# Patient Record
Sex: Female | Born: 1990 | Hispanic: Yes | Marital: Single | Smoking: Never smoker
Health system: Southern US, Community
[De-identification: ages and names within clinical notes are randomized; demographics above are authoritative.]

## PROBLEM LIST (undated history)

## (undated) DIAGNOSIS — Z789 Other specified health status: Secondary | ICD-10-CM

---

## 2012-08-05 ENCOUNTER — Ambulatory Visit: Payer: Self-pay | Admitting: Advanced Practice Midwife

## 2012-09-17 ENCOUNTER — Ambulatory Visit: Payer: Self-pay | Admitting: Advanced Practice Midwife

## 2013-02-10 ENCOUNTER — Inpatient Hospital Stay: Payer: Self-pay | Admitting: Obstetrics and Gynecology

## 2013-02-10 LAB — CBC WITH DIFFERENTIAL/PLATELET
BASOS ABS: 0 10*3/uL (ref 0.0–0.1)
Basophil %: 0.1 %
EOS PCT: 0.3 %
Eosinophil #: 0 10*3/uL (ref 0.0–0.7)
HCT: 35.5 % (ref 35.0–47.0)
HGB: 12.2 g/dL (ref 12.0–16.0)
Lymphocyte #: 1.7 10*3/uL (ref 1.0–3.6)
Lymphocyte %: 12.8 %
MCH: 30.8 pg (ref 26.0–34.0)
MCHC: 34.5 g/dL (ref 32.0–36.0)
MCV: 89 fL (ref 80–100)
MONO ABS: 0.7 x10 3/mm (ref 0.2–0.9)
Monocyte %: 5 %
Neutrophil #: 10.7 10*3/uL — ABNORMAL HIGH (ref 1.4–6.5)
Neutrophil %: 81.8 %
Platelet: 260 10*3/uL (ref 150–440)
RBC: 3.97 10*6/uL (ref 3.80–5.20)
RDW: 12.8 % (ref 11.5–14.5)
WBC: 13 10*3/uL — AB (ref 3.6–11.0)

## 2013-02-10 LAB — HEMOGLOBIN: HGB: 9.4 g/dL — ABNORMAL LOW (ref 12.0–16.0)

## 2013-02-10 LAB — GC/CHLAMYDIA PROBE AMP

## 2013-02-11 LAB — HEMOGLOBIN: HGB: 6.8 g/dL — ABNORMAL LOW (ref 12.0–16.0)

## 2013-02-11 LAB — HEMATOCRIT: HCT: 20.1 % — AB (ref 35.0–47.0)

## 2013-02-12 LAB — HEMATOCRIT: HCT: 18.4 % — ABNORMAL LOW (ref 35.0–47.0)

## 2014-06-08 NOTE — H&P (Signed)
L&D Evaluation:  History:  HPI P0 at term active labor   Presents with contractions   Patient's Medical History No Chronic Illness   Patient's Surgical History none   Medications Pre Serbiaatal Vitamins   Social History none   Family History Non-Contributory   ROS:  ROS All systems were reviewed.  HEENT, CNS, GI, GU, Respiratory, CV, Renal and Musculoskeletal systems were found to be normal.   Exam:  Vital Signs stable   Abdomen gravid, tender with contractions   Estimated Fetal Weight Average for gestational age   Back no CVAT   Reflexes 1+   Pelvic c/c   Mebranes Ruptured   FHT normal rate with no decels   Ucx regular   Impression:  Impression active labor   Plan:  Comments antic svd   Electronic Signatures: Margaretha GlassingEvans, Ricky L (MD)  (Signed 13-Jan-15 10:57)  Authored: L&D Evaluation   Last Updated: 13-Jan-15 10:57 by Margaretha GlassingEvans, Ricky L (MD)

## 2016-01-30 NOTE — L&D Delivery Note (Signed)
Delivery Note At 8:30 AM a viable and healthy female "Erica Livingston" was delivered via Vaginal, Spontaneous (Presentation: DOA; compound with arm ).  APGAR: 9, 9; weight 7 lb 6.9 oz (3370 g).   Placenta status: spontaneous.  Cord: 3VC, nuchal, delivered through with the following complications: none.  Anesthesia:  local Episiotomy: None Lacerations:  Directly clitoral Suture Repair: 3.0 vicryl rapide Est. Blood Loss (mL):  800  Mom to postpartum.  Baby to Couplet care / Skin to Skin.  26yo G2P1001 at 39+1wks presented in active labor at 8cm. She progressed quickly to fully dilated, was AROMed for meconium stained fluid, and pushed through a single push. The baby delivered with head and then immediately the body followed in a single push, with an anterior compound arm and nuchal cord. The baby was vigorous and placed on maternal abdomen. Delayed cord clamping x60 sec. Placenta spontaneously delivered and continuous bleeding noted. Examination of perineum found actively bleeding direct superior tear through clitorus. 1% lidocaine, 15ml used, and red rubber placed. Figure of 8 and running suture used to reapproximate tissue and establish hemostasis. Small perineal tear without bleeding and not through the mucosa - inclusion cyst expressed.   Continued uterine bleeding, slow trickle, noted. 2 manual evacuations of LUS clot with firm fundus and resolution after each. 800mcg rectal cytotec and 0.2mg  methergine IM given. At this time, small continued bleeding noted.   Erica Livingston 12/19/2016, 9:14 AM

## 2016-05-30 ENCOUNTER — Other Ambulatory Visit: Payer: Self-pay | Admitting: Nurse Practitioner

## 2016-05-30 DIAGNOSIS — Z369 Encounter for antenatal screening, unspecified: Secondary | ICD-10-CM

## 2016-05-31 LAB — OB RESULTS CONSOLE RUBELLA ANTIBODY, IGM: RUBELLA: IMMUNE

## 2016-05-31 LAB — OB RESULTS CONSOLE ABO/RH: RH Type: POSITIVE

## 2016-05-31 LAB — OB RESULTS CONSOLE HEPATITIS B SURFACE ANTIGEN: Hepatitis B Surface Ag: NEGATIVE

## 2016-05-31 LAB — OB RESULTS CONSOLE VARICELLA ZOSTER ANTIBODY, IGG: VARICELLA IGG: NON-IMMUNE/NOT IMMUNE

## 2016-05-31 LAB — OB RESULTS CONSOLE RPR: RPR: NONREACTIVE

## 2016-05-31 LAB — OB RESULTS CONSOLE ANTIBODY SCREEN: ANTIBODY SCREEN: NEGATIVE

## 2016-06-18 ENCOUNTER — Encounter: Payer: Self-pay | Admitting: *Deleted

## 2016-06-18 ENCOUNTER — Ambulatory Visit
Admission: RE | Admit: 2016-06-18 | Discharge: 2016-06-18 | Disposition: A | Discharge status: Home / Self Care | Payer: Medicaid Other | Source: Ambulatory Visit | Attending: Maternal & Fetal Medicine | Admitting: Maternal & Fetal Medicine

## 2016-06-18 ENCOUNTER — Ambulatory Visit (HOSPITAL_BASED_OUTPATIENT_CLINIC_OR_DEPARTMENT_OTHER)
Admission: RE | Admit: 2016-06-18 | Discharge: 2016-06-18 | Disposition: A | Discharge status: Home / Self Care | Payer: Medicaid Other | Source: Ambulatory Visit | Attending: Maternal & Fetal Medicine | Admitting: Maternal & Fetal Medicine

## 2016-06-18 VITALS — BP 116/54 | HR 72 | Temp 97.9°F | Resp 16 | Ht 61.0 in | Wt 129.0 lb

## 2016-06-18 DIAGNOSIS — Z369 Encounter for antenatal screening, unspecified: Secondary | ICD-10-CM

## 2016-06-18 DIAGNOSIS — Z3689 Encounter for other specified antenatal screening: Secondary | ICD-10-CM | POA: Insufficient documentation

## 2016-06-18 DIAGNOSIS — O3481 Maternal care for other abnormalities of pelvic organs, first trimester: Secondary | ICD-10-CM | POA: Insufficient documentation

## 2016-06-18 DIAGNOSIS — N83201 Unspecified ovarian cyst, right side: Secondary | ICD-10-CM | POA: Diagnosis not present

## 2016-06-18 HISTORY — DX: Other specified health status: Z78.9

## 2016-06-18 NOTE — Progress Notes (Addendum)
Referring physician:  [facility] Department Length of Consultation: 45 minutes   Erica Livingston  was referred to [facility] for genetic counseling to review prenatal screening and testing options.  This note summarizes the information we discussed.    We offered the following routine screening tests for this pregnancy:  First trimester screening, which includes nuchal translucency ultrasound screen and first trimester maternal serum marker screening.  The nuchal translucency has approximately an 80% detection rate for Down syndrome and can be positive for other chromosome abnormalities as well as congenital heart defects.  When combined with a maternal serum marker screening, the detection rate is up to 90% for Down syndrome and up to 97% for trisomy 18.     Maternal serum marker screening, a blood test that measures pregnancy proteins, can provide risk assessments for Down syndrome, trisomy 18, and open neural tube defects (spina bifida, anencephaly). Because it does not directly examine the fetus, it cannot positively diagnose or rule out these problems.  Targeted ultrasound uses high frequency sound waves to create an image of the developing fetus.  An ultrasound is often recommended as a routine means of evaluating the pregnancy.  It is also used to screen for fetal anatomy problems (for example, a heart defect) that might be suggestive of a chromosomal or other abnormality.   Should these screening tests indicate an increased concern, then the following additional testing options would be offered:  The chorionic villus sampling procedure is available for first trimester chromosome analysis.  This involves the withdrawal of a small amount of chorionic villi (tissue from the developing placenta).  Risk of pregnancy loss is estimated to be approximately 1 in 200 to 1 in 100 (0.5 to 1%).  There is approximately a 1% (1 in 100) chance that the CVS  chromosome results will be unclear.  Chorionic villi cannot be tested for neural tube defects.     Amniocentesis involves the removal of a small amount of amniotic fluid from the sac surrounding the fetus with the use of a thin needle inserted through the maternal abdomen and uterus.  Ultrasound guidance is used throughout the procedure.  Fetal cells from amniotic fluid are directly evaluated and > 99.5% of chromosome problems and > 98% of open neural tube defects can be detected. This procedure is generally performed after the 15th week of pregnancy.  The main risks to this procedure include complications leading to miscarriage in less than 1 in 200 cases (0.5%).  As another option for information if the pregnancy is suspected to be an an increased chance for certain chromosome conditions, we also reviewed the availability of cell free fetal DNA testing from maternal blood to determine whether or not the baby may have either Down syndrome, trisomy 45, or trisomy 110.  This test utilizes a maternal blood sample and DNA sequencing technology to isolate circulating cell free fetal DNA from maternal plasma.  The fetal DNA can then be analyzed for DNA sequences that are derived from the three most common chromosomes involved in aneuploidy, chromosomes 13, 18, and 21.  If the overall amount of DNA is greater than the expected level for any of these chromosomes, aneuploidy is suspected.  While we do not consider it a replacement for invasive testing and karyotype analysis, a negative result from this testing would be reassuring, though not a guarantee of a normal chromosome complement for the baby.  An abnormal result is certainly suggestive of an abnormal  chromosome complement, though we would still recommend CVS or amniocentesis to confirm any findings from this testing.  Cystic Fibrosis and Spinal Muscular Atrophy (SMA) screening were also discussed with the patient. Both conditions are recessive, which means that  both parents must be carriers in order to have a child with the disease.  Cystic fibrosis (CF) is one of the most common genetic conditions in persons of Caucasian ancestry.  This condition occurs in approximately 1 in 2,500 Caucasian persons and results in thickened secretions in the lungs, digestive, and reproductive systems.  For a baby to be at risk for having CF, both of the parents must be carriers for this condition.  Approximately 1 in 50 Caucasian persons is a carrier for CF.  Current carrier testing looks for the most common mutations in the gene for CF and can detect approximately 90% of carriers in the Caucasian population.  This means that the carrier screening can greatly reduce, but cannot eliminate, the chance for an individual to have a child with CF.  If an individual is found to be a carrier for CF, then carrier testing would be available for the partner. As part of [place]'s newborn screening profile, all babies born in the state of [place] will have a two-tier screening process.  Specimens are first tested to determine the concentration of immunoreactive trypsinogen (IRT).  The top 5% of specimens with the highest IRT values then undergo DNA testing using a panel of over 40 common CF mutations. SMA is a neurodegenerative disorder that leads to atrophy of skeletal muscle and overall weakness.  This condition is also more prevalent in the Caucasian population, with 1 in 40-1 in 60 persons being a carrier and 1 in 6,000-1 in 10,000 children being affected.  There are multiple forms of the disease, with some causing death in infancy to other forms with survival into adulthood.  The genetics of SMA is complex, but carrier screening can detect up to 95% of carriers in the Caucasian population.  Similar to CF, a negative result can greatly reduce, but cannot eliminate, the chance to have a child with SMA.  We obtained a detailed family history and pregnancy history.  The patient  reported one paternal first cousin of the father of the baby who had lifelong disabilities and passed away at 27 years of age.  She is not aware of the cause for her condition, but states that she could not walk to talk or move normally.  We discussed that there may be many different reasons for disabilities but that without a specific name for the condition or known cause, it is difficult to estimate the chance for other family members to have a similar condition.  If more is learned or medical records are available, we are happy to discuss this further.  The remainder of the family history was reported to be unremarkable for birth defects, intellectual delays, recurrent pregnancy loss or known chromosome abnormalities.  Ms. Lysette Lindenbaum stated that this is her second pregnancy.  She and her partner have a healthy 47 year old daughter together.  She reported no complications or exposures in this pregnancy that would be expected to increase the risk for birth defects.  Prior testing at [facility] was normal for hemoglobinopathies.  After consideration of the options, Ms. Christyann Manolis elected to proceed with first trimester screening.  She declined CF and SMA carrier screening.  An ultrasound was performed at the time of the visit.  The gestational age  was consistent with  12 weeks.  Fetal anatomy could not be assessed due to early gestational age.  Please refer to the ultrasound report for details of that study.  Ms. Eilene GhaziMartinez Moreno was encouraged to call with questions or concerns.  We can be contacted at [phone].    Cherly Andersoneborah F. Wells, MS, CGC  I was immediately available and supervising. Argentina PonderAndra H. Saleem Coccia, MD [facility].

## 2016-06-21 ENCOUNTER — Telehealth: Payer: Self-pay | Admitting: Obstetrics and Gynecology

## 2016-06-21 NOTE — Telephone Encounter (Signed)
  Ms. Erica Livingston elected to undergo First Trimester screening on 06/18/2016.  To review, first trimester screening, includes nuchal translucency ultrasound screen and/or first trimester maternal serum marker screening.  The nuchal translucency has approximately an 80% detection rate for Down syndrome and can be positive for other chromosome abnormalities as well as heart defects.  When combined with a maternal serum marker screening, the detection rate is up to 90% for Down syndrome and up to 97% for trisomy 13 and 18.     The results of the First Trimester Nuchal Translucency and Biochemical Screening were within normal range.  The risk for Down syndrome is now estimated to be less than 1 in 10,000.  The risk for Trisomy 13/18 is also estimated to be less than 1 in 10,000.  Should more definitive information be desired, we would offer amniocentesis.  Because we do not yet know the effectiveness of combined first and second trimester screening, we do not recommend a maternal serum screen to assess the chance for chromosome conditions.  However, if screening for neural tube defects is desired, maternal serum screening for AFP only can be performed between 15 and [redacted] weeks gestation.      Erica Andersoneborah F. Atlas Crossland, MS, CGC

## 2016-07-26 ENCOUNTER — Other Ambulatory Visit: Payer: Self-pay | Admitting: *Deleted

## 2016-07-26 DIAGNOSIS — Z3689 Encounter for other specified antenatal screening: Secondary | ICD-10-CM

## 2016-07-30 ENCOUNTER — Ambulatory Visit
Admission: RE | Admit: 2016-07-30 | Discharge: 2016-07-30 | Disposition: A | Discharge status: Home / Self Care | Payer: Self-pay | Source: Ambulatory Visit | Attending: Obstetrics and Gynecology | Admitting: Obstetrics and Gynecology

## 2016-07-30 DIAGNOSIS — Z3689 Encounter for other specified antenatal screening: Secondary | ICD-10-CM | POA: Insufficient documentation

## 2016-07-30 DIAGNOSIS — Z3A18 18 weeks gestation of pregnancy: Secondary | ICD-10-CM | POA: Insufficient documentation

## 2016-10-08 LAB — OB RESULTS CONSOLE HIV ANTIBODY (ROUTINE TESTING): HIV: NONREACTIVE

## 2016-11-26 LAB — OB RESULTS CONSOLE GC/CHLAMYDIA
CHLAMYDIA, DNA PROBE: NEGATIVE
GC PROBE AMP, GENITAL: NEGATIVE

## 2016-11-26 LAB — OB RESULTS CONSOLE GBS: GBS: NEGATIVE

## 2016-12-19 ENCOUNTER — Other Ambulatory Visit: Payer: Self-pay

## 2016-12-19 ENCOUNTER — Inpatient Hospital Stay
Admission: EM | Admit: 2016-12-19 | Discharge: 2016-12-20 | DRG: 807 | Disposition: A | Discharge status: Home / Self Care | Payer: Medicaid Other | Attending: Obstetrics and Gynecology | Admitting: Obstetrics and Gynecology

## 2016-12-19 DIAGNOSIS — O326XX0 Maternal care for compound presentation, not applicable or unspecified: Secondary | ICD-10-CM | POA: Diagnosis present

## 2016-12-19 DIAGNOSIS — Z3493 Encounter for supervision of normal pregnancy, unspecified, third trimester: Secondary | ICD-10-CM

## 2016-12-19 DIAGNOSIS — Z3A39 39 weeks gestation of pregnancy: Secondary | ICD-10-CM | POA: Diagnosis not present

## 2016-12-19 DIAGNOSIS — Z3483 Encounter for supervision of other normal pregnancy, third trimester: Secondary | ICD-10-CM | POA: Diagnosis present

## 2016-12-19 LAB — TYPE AND SCREEN
ABO/RH(D): B POS
Antibody Screen: NEGATIVE

## 2016-12-19 LAB — CBC
HEMATOCRIT: 40.8 % (ref 35.0–47.0)
Hemoglobin: 14.1 g/dL (ref 12.0–16.0)
MCH: 31.5 pg (ref 26.0–34.0)
MCHC: 34.6 g/dL (ref 32.0–36.0)
MCV: 91.1 fL (ref 80.0–100.0)
Platelets: 253 10*3/uL (ref 150–440)
RBC: 4.48 MIL/uL (ref 3.80–5.20)
RDW: 12.8 % (ref 11.5–14.5)
WBC: 12.8 10*3/uL — ABNORMAL HIGH (ref 3.6–11.0)

## 2016-12-19 MED ORDER — SIMETHICONE 80 MG PO CHEW: 80.0000 mg | CHEWABLE_TABLET | ORAL | Status: DC | PRN

## 2016-12-19 MED ORDER — SOD CITRATE-CITRIC ACID 500-334 MG/5ML PO SOLN: 30.0000 mL | ORAL | Status: DC | PRN

## 2016-12-19 MED ORDER — LACTATED RINGERS IV SOLN
500.0000 mL | INTRAVENOUS | Status: DC | PRN
Start: 2016-12-19 — End: 2016-12-20

## 2016-12-19 MED ORDER — OXYTOCIN 10 UNIT/ML IJ SOLN
INTRAMUSCULAR | Status: AC
  Filled 2016-12-19: qty 2

## 2016-12-19 MED ORDER — ONDANSETRON HCL 4 MG/2ML IJ SOLN: 4.0000 mg | INTRAMUSCULAR | Status: DC | PRN

## 2016-12-19 MED ORDER — METHYLERGONOVINE MALEATE 0.2 MG/ML IJ SOLN
0.2000 mg | Freq: Once | INTRAMUSCULAR | Status: AC
  Administered 2016-12-19: 0.2 mg via INTRAMUSCULAR

## 2016-12-19 MED ORDER — BENZOCAINE-MENTHOL 20-0.5 % EX AERO: 1.0000 "application " | INHALATION_SPRAY | CUTANEOUS | Status: DC | PRN

## 2016-12-19 MED ORDER — MISOPROSTOL 200 MCG PO TABS
ORAL_TABLET | ORAL | Status: AC
  Administered 2016-12-19: 800 ug
  Filled 2016-12-19: qty 4

## 2016-12-19 MED ORDER — ACETAMINOPHEN 325 MG PO TABS: 650.0000 mg | ORAL_TABLET | ORAL | Status: DC | PRN

## 2016-12-19 MED ORDER — BUTORPHANOL TARTRATE 1 MG/ML IJ SOLN: 1.0000 mg | INTRAMUSCULAR | Status: DC | PRN

## 2016-12-19 MED ORDER — LIDOCAINE HCL (PF) 1 % IJ SOLN: 30.0000 mL | INTRAMUSCULAR | Status: DC | PRN

## 2016-12-19 MED ORDER — OXYCODONE HCL 5 MG PO TABS: 10.0000 mg | ORAL_TABLET | ORAL | Status: DC | PRN

## 2016-12-19 MED ORDER — LACTATED RINGERS IV SOLN
INTRAVENOUS | Status: DC
  Administered 2016-12-19: 08:00:00 via INTRAVENOUS

## 2016-12-19 MED ORDER — LIDOCAINE HCL (PF) 1 % IJ SOLN
INTRAMUSCULAR | Status: AC
  Filled 2016-12-19: qty 30

## 2016-12-19 MED ORDER — HYDROCORTISONE 2.5 % RE CREA
TOPICAL_CREAM | RECTAL | Status: DC | PRN
Start: 2016-12-19 — End: 2016-12-20
  Filled 2016-12-19: qty 28.35

## 2016-12-19 MED ORDER — METHYLERGONOVINE MALEATE 0.2 MG/ML IJ SOLN
INTRAMUSCULAR | Status: AC
  Administered 2016-12-19: 0.2 mg via INTRAMUSCULAR
  Filled 2016-12-19: qty 1

## 2016-12-19 MED ORDER — OXYTOCIN BOLUS FROM INFUSION
500.0000 mL | Freq: Once | INTRAVENOUS | Status: AC
  Administered 2016-12-19: 500 mL via INTRAVENOUS

## 2016-12-19 MED ORDER — OXYTOCIN 40 UNITS IN LACTATED RINGERS INFUSION - SIMPLE MED
2.5000 [IU]/h | INTRAVENOUS | Status: DC
  Administered 2016-12-19: 2.5 [IU]/h via INTRAVENOUS

## 2016-12-19 MED ORDER — PRENATAL MULTIVITAMIN CH
1.0000 | ORAL_TABLET | Freq: Every day | ORAL | Status: DC
  Administered 2016-12-19: 1 via ORAL
  Filled 2016-12-19: qty 1

## 2016-12-19 MED ORDER — ONDANSETRON HCL 4 MG/2ML IJ SOLN: 4.0000 mg | Freq: Four times a day (QID) | INTRAMUSCULAR | Status: DC | PRN

## 2016-12-19 MED ORDER — DIPHENHYDRAMINE HCL 25 MG PO CAPS: 25.0000 mg | ORAL_CAPSULE | Freq: Four times a day (QID) | ORAL | Status: DC | PRN

## 2016-12-19 MED ORDER — OXYTOCIN 10 UNIT/ML IJ SOLN
INTRAMUSCULAR | Status: AC
  Administered 2016-12-19: 40 [IU] via INTRAVENOUS
  Filled 2016-12-19: qty 2

## 2016-12-19 MED ORDER — AMMONIA AROMATIC IN INHA
RESPIRATORY_TRACT | Status: AC
  Filled 2016-12-19: qty 10

## 2016-12-19 MED ORDER — COCONUT OIL OIL: 1.0000 "application " | TOPICAL_OIL | Status: DC | PRN

## 2016-12-19 MED ORDER — DOCUSATE SODIUM 100 MG PO CAPS
100.0000 mg | ORAL_CAPSULE | Freq: Two times a day (BID) | ORAL | Status: DC
  Administered 2016-12-19: 100 mg via ORAL
  Filled 2016-12-19: qty 1

## 2016-12-19 MED ORDER — ACETAMINOPHEN 500 MG PO TABS: 1000.0000 mg | ORAL_TABLET | Freq: Four times a day (QID) | ORAL | Status: DC | PRN

## 2016-12-19 MED ORDER — OXYCODONE HCL 5 MG PO TABS: 5.0000 mg | ORAL_TABLET | ORAL | Status: DC | PRN

## 2016-12-19 MED ORDER — IBUPROFEN 600 MG PO TABS
600.0000 mg | ORAL_TABLET | Freq: Four times a day (QID) | ORAL | Status: DC
  Administered 2016-12-19 – 2016-12-20 (×3): 600 mg via ORAL
  Filled 2016-12-19 (×3): qty 1

## 2016-12-19 MED ORDER — WITCH HAZEL-GLYCERIN EX PADS: 1.0000 "application " | MEDICATED_PAD | CUTANEOUS | Status: DC

## 2016-12-19 MED ORDER — ONDANSETRON HCL 4 MG PO TABS: 4.0000 mg | ORAL_TABLET | ORAL | Status: DC | PRN

## 2016-12-19 NOTE — OB Triage Note (Signed)
Patient c/o of contractions since 0400. Feel contractions in lower abdomen, stating contractions are about every 5 mins apart. Denies vaginal bleeding, denies LOF, reports positive FM.

## 2016-12-19 NOTE — H&P (Signed)
  OB ADMISSION/ HISTORY & PHYSICAL:  Admission Date: 12/19/2016  6:46 AM  Admit Diagnosis: active labor at 8 cm  Erica Livingston is a 26 y.o. female G2P1001 at 39+1wks presenting for contractions x4 hours. Found to be 8cm with bulging bag on presentation.  Baby boy  Prenatal History: G2P0   EDC : 12/25/2016, by Last Menstrual Period  Prenatal care at [facility] Prenatal course complicated by  - varicella non-immune  Prenatal Labs: ABO, Rh:   B postitive Antibody:   neg Rubella:   immune RPR:   non reactive HBsAg:   neg HIV:   neg GTT: 120 GBS:   neg  Declined flu shot Tdap on 10/08/16  Medical / Surgical History :  Past medical history:  Past Medical History:  Diagnosis Date  . Medical history non-contributory      Past surgical history: History reviewed. No pertinent surgical history.  Family History: No family history on file.   Social History:  reports that  has never smoked. she has never used smokeless tobacco. Her alcohol and drug histories are not on file.   Allergies: Patient has no known allergies.    Current Medications at time of admission:  Prior to Admission medications   Medication Sig Start Date End Date Taking? Authorizing Provider  Prenatal Vit-Fe Fumarate-FA (MULTIVITAMIN-PRENATAL) 27-0.8 MG TABS tablet Take 1 tablet by mouth daily at 12 noon.   Yes [provider]     Review of Systems: Active FM onset of ctx @ 4:00 currently every 2 minutes LOF  / SROM @ - not yet bloody show yes   Physical Exam:  VS: Blood pressure (!) 123/53, pulse 69, temperature 98.2 F (36.8 C), temperature source Oral, resp. rate 18, last menstrual period 03/20/2016.  General: alert and oriented, appears in mod distress Heart: RRR Lungs: Clear lung fields Abdomen: Gravid, soft and non-tender, non-distended / uterus: non tender Extremities: 1+ edema  Genitalia / VE:    FHR: baseline rate 145 / variability mod / accelerations + / no  decelerations TOCO: q2 min  Last US 19wks, normal anatomy  Assessment: 39+[redacted] weeks gestation 1 stage of labor FHR category 1   Plan:   Admit for active labor Labs pending Epidural when desired Continuous fetal monitoring   1. Fetal Well being  - Fetal Tracing: Cat I  - Ultrasound:  reviewed, as above - Group B Streptococcus: neg - Presentation: vtx confirmed by sutures   2. Routine OB: - Prenatal labs reviewed, as above - Rh positive  4. Post Partum Planning: - Infant feeding: breast and bottle - Contraception: nexplanon

## 2016-12-19 NOTE — Discharge Summary (Signed)
Obstetrical Discharge Summary  Patient Name: Erica DavidsonKeyla Martinez Moreno DOB: May 03, 1990 MRN: [id number]  Date of Admission: 12/19/2016 Date of Discharge: 12/19/2016  Primary OB:  [facility]  Gestational Age at Delivery: 152w1d   Antepartum complications: varicella non-immune Admitting Diagnosis: active labor Secondary Diagnosis: Patient Active Problem List   Diagnosis Date Noted  . Supervision of low-risk pregnancy, third trimester 12/19/2016  . First trimester screening     Augmentation: AROM Complications: None - vaginal delivery with 800ml blood loss Intrapartum complications/course:   26yo G2P1001 at 39+1wks presented in active labor at 8cm. She progressed quickly to fully dilated, was AROMed for meconium stained fluid, and pushed through a single push. The baby delivered with head and then immediately the body followed in a single push, with an anterior compound arm and nuchal cord. The baby was vigorous and placed on maternal abdomen. Delayed cord clamping x60 sec. Placenta spontaneously delivered and continuous bleeding noted. Examination of perineum found actively bleeding direct superior tear through clitorus. 1% lidocaine, 15ml used, and red rubber placed. Figure of 8 and running suture used to reapproximate tissue and establish hemostasis. Small perineal tear without bleeding and not through the mucosa - inclusion cyst expressed.   Continued uterine bleeding, slow trickle, noted. 2 manual evacuations of LUS clot with firm fundus and resolution after each. 800mcg rectal cytotec and 0.2mg  methergine IM given. At this time, small continued bleeding noted.     Date of Delivery: 12/19/16  Delivered By: Christeen DouglasBethany Beasley Delivery Type: spontaneous vaginal delivery Anesthesia: none Placenta: Spontaneous Laceration: clitoral Episiotomy: none Newborn Data: Live born female "Barbara CowerJason" Birth Weight: 7 lb 6.9 oz (3370 g) APGAR: 9, 9  Newborn Delivery   Birth date/time:  12/19/2016  08:30:00 Delivery type:  Vaginal, Spontaneous       Discharge Physical Exam: Stable  BP (!) 123/53   Pulse 69   Temp 98.2 F (36.8 C) (Oral)   Resp 18   LMP 03/20/2016   General: NAD CV: RRR Pulm: CTABL, nl effort ABD: s/nd/nt, fundus firm and below the umbilicus Lochia: moderate DVT Evaluation: LE non-ttp, no evidence of DVT on exam.  Hemoglobin  Date Value Ref Range Status  12/19/2016 14.1 12.0 - 16.0 g/dL Final   HGB  Date Value Ref Range Status  02/11/2013 6.8 (L) 12.0 - 16.0 g/dL Final   HCT  Date Value Ref Range Status  12/19/2016 40.8 35.0 - 47.0 % Final  02/12/2013 18.4 (L) 35.0 - 47.0 % Final    Post partum course: Stable  Postpartum Procedures:None  Disposition: stable, discharge to home. Baby Feeding: breastmilk and formula Baby Disposition: home with mom  Rh Immune globulin given: n/a Rubella vaccine given: n/a Tdap vaccine given in AP or PP setting: 10/08/16 Flu vaccine given in AP or PP setting: declined Varicella:non-immune Contraception: Nexplanon  Prenatal Labs:  ABO, Rh:   B postitive Antibody:   neg Rubella:   immune RPR:   non reactive HBsAg:   neg HIV:   neg GTT: 120 GBS:   neg     Plan:  Erica DavidsonKeyla Martinez Moreno was discharged to home in good condition. [facility] 6 weeks   Discharge Medications: Ibuprofen 600 mg by mouth every 6 hours for pain. Take Prenatal vitamins daily. Add over the counter Iron pills. [facility]   Signed: Sharee Pimplearon W. Jones, RN, MSN, CNM, FNP

## 2016-12-20 LAB — CBC
HEMATOCRIT: 30.6 % — AB (ref 35.0–47.0)
HEMOGLOBIN: 10.7 g/dL — AB (ref 12.0–16.0)
MCH: 31.9 pg (ref 26.0–34.0)
MCHC: 35 g/dL (ref 32.0–36.0)
MCV: 91.3 fL (ref 80.0–100.0)
Platelets: 185 10*3/uL (ref 150–440)
RBC: 3.35 MIL/uL — AB (ref 3.80–5.20)
RDW: 12.8 % (ref 11.5–14.5)
WBC: 8.7 10*3/uL (ref 3.6–11.0)

## 2016-12-20 LAB — RPR: RPR Ser Ql: NONREACTIVE

## 2016-12-20 MED ORDER — VARICELLA VIRUS VACCINE LIVE 1350 PFU/0.5ML IJ SUSR
0.5000 mL | Freq: Once | INTRAMUSCULAR | Status: AC
  Administered 2016-12-20: 0.5 mL via SUBCUTANEOUS
  Filled 2016-12-20: qty 0.5

## 2016-12-20 NOTE — Discharge Instructions (Signed)
No driving x 2 weeks. No sex x 6 weeks. Ibuprofen 600 mg po every 6 hours for pain if needed.

## 2016-12-20 NOTE — Progress Notes (Signed)
Post Partum Day 1 (Via Interpreter) Subjective: "I would like to go home today"  Objective: Blood pressure (!) 112/58, pulse 91, temperature 98.4 F (36.9 C), temperature source Oral, resp. rate 18, last menstrual period 03/20/2016, SpO2 99 %.  Physical Exam:  General: A,A& O Lochia: appropriate Uterine Fundus: firm Incision: healing well DVT Evaluation: No evidence of DVT seen on physical exam.  Recent Labs    12/19/16 0817 12/20/16 0550  HGB 14.1 10.7*  HCT 40.8 30.6*    Assessment/Plan: Discharge home   LOS: 1 day   Sharee Pimplearon W Jones 12/20/2016, 10:10 AM

## 2017-03-19 ENCOUNTER — Encounter (HOSPITAL_COMMUNITY): Payer: Self-pay

## 2018-02-26 ENCOUNTER — Encounter: Payer: Self-pay | Admitting: Emergency Medicine

## 2018-02-26 ENCOUNTER — Emergency Department: Payer: Self-pay

## 2018-02-26 ENCOUNTER — Other Ambulatory Visit: Payer: Self-pay

## 2018-02-26 ENCOUNTER — Emergency Department
Admission: EM | Admit: 2018-02-26 | Discharge: 2018-02-26 | Disposition: A | Discharge status: Home / Self Care | Payer: Self-pay | Attending: Emergency Medicine | Admitting: Emergency Medicine

## 2018-02-26 DIAGNOSIS — J181 Lobar pneumonia, unspecified organism: Secondary | ICD-10-CM | POA: Insufficient documentation

## 2018-02-26 DIAGNOSIS — J189 Pneumonia, unspecified organism: Secondary | ICD-10-CM

## 2018-02-26 LAB — INFLUENZA PANEL BY PCR (TYPE A & B)
INFLBPCR: NEGATIVE
Influenza A By PCR: NEGATIVE

## 2018-02-26 MED ORDER — ACETAMINOPHEN 500 MG PO TABS
1000.0000 mg | ORAL_TABLET | Freq: Once | ORAL | Status: AC
  Administered 2018-02-26: 1000 mg via ORAL
  Filled 2018-02-26: qty 2

## 2018-02-26 MED ORDER — ALBUTEROL SULFATE HFA 108 (90 BASE) MCG/ACT IN AERS: 2.0000 | INHALATION_SPRAY | Freq: Four times a day (QID) | RESPIRATORY_TRACT | 0 refills | Status: AC | PRN

## 2018-02-26 MED ORDER — BENZONATATE 100 MG PO CAPS: 100.0000 mg | ORAL_CAPSULE | Freq: Four times a day (QID) | ORAL | 0 refills | Status: AC | PRN

## 2018-02-26 MED ORDER — AZITHROMYCIN 500 MG PO TABS
500.0000 mg | ORAL_TABLET | Freq: Once | ORAL | Status: AC
  Administered 2018-02-26: 500 mg via ORAL
  Filled 2018-02-26: qty 1

## 2018-02-26 MED ORDER — AZITHROMYCIN 250 MG PO TABS: 250.0000 mg | ORAL_TABLET | Freq: Every day | ORAL | 0 refills | Status: AC

## 2018-02-26 MED ORDER — BENZONATATE 100 MG PO CAPS
200.0000 mg | ORAL_CAPSULE | Freq: Once | ORAL | Status: AC
  Administered 2018-02-26: 200 mg via ORAL
  Filled 2018-02-26 (×2): qty 2

## 2018-02-26 MED ORDER — IPRATROPIUM-ALBUTEROL 0.5-2.5 (3) MG/3ML IN SOLN
3.0000 mL | Freq: Once | RESPIRATORY_TRACT | Status: AC
Start: 2018-02-26 — End: 2018-02-26
  Administered 2018-02-26: 3 mL via RESPIRATORY_TRACT
  Filled 2018-02-26: qty 3

## 2018-02-26 NOTE — ED Provider Notes (Addendum)
[facility] Emergency Department Provider Note  ____________________________________________  Time seen: Approximately 5:53 PM  I have reviewed the triage vital signs and the nursing notes.   HISTORY  Chief Complaint Cough    HPI Erica Livingston is a 28 y.o. female, presenting with cough, chest tightness and shortness of breath.  The patient reports that for the past 6 days, she has had a cough that is occasionally productive of a yellow phlegm.  She denies any associated fevers or chills, sore throat, ear pain.  She reports that when she lays down at night, her coughing spasms worsen, and she gets a chest tightness which only improves if she presses down really hard on her sternum.  This is associated with some shortness of breath but no palpitations, lightheadedness or syncope.  She has not had any lower extremity swelling or calf pain.  She did not have her influenza vaccination this year.  She denies any GI symptoms.  Past Medical History:  Diagnosis Date  . Medical history non-contributory     Patient Active Problem List   Diagnosis Date Noted  . Supervision of low-risk pregnancy, third trimester 12/19/2016  . First trimester screening     History reviewed. No pertinent surgical history.  Current Outpatient Rx  . Order #: [id number] Class: Print  . Order #: [id number] Class: Print  . Order #: [id number] Class: Print  . Order #: [id number] Class: Historical Med    Allergies Patient has no known allergies.  No family history on file.  Social History Social History   Tobacco Use  . Smoking status: Never Smoker  . Smokeless tobacco: Never Used  Substance Use Topics  . Alcohol use: Not on file  . Drug use: Not on file    Review of Systems Constitutional: No fever/chills.  No lightheadedness or syncope.  No diaphoresis. Eyes: No visual changes. ENT: No sore throat. No congestion or rhinorrhea.  No ear pain. Cardiovascular: Positive chest  pain. Denies palpitations. Respiratory: Positive shortness of breath.  As it is productive cough. Gastrointestinal: No abdominal pain.  No nausea, no vomiting.  No diarrhea.  No constipation. Genitourinary: Negative for dysuria. Musculoskeletal: Negative for back pain.  No lower extremity swelling or calf pain. Skin: Negative for rash. Neurological: Negative for headaches. No focal numbness, tingling or weakness.     ____________________________________________   PHYSICAL EXAM:  VITAL SIGNS: ED Triage Vitals  Enc Vitals Group     BP 02/26/18 1558 127/75     Pulse Rate 02/26/18 1558 76     Resp 02/26/18 1558 18     Temp 02/26/18 1558 98.8 F (37.1 C)     Temp Source 02/26/18 1558 Oral     SpO2 02/26/18 1558 97 %     Weight 02/26/18 1602 137 lb (62.1 kg)     Height 02/26/18 1602 5\' 2"  (1.575 m)     Head Circumference --      Peak Flow --      Pain Score --      Pain Loc --      Pain Edu? --      Excl. in GC? --     Constitutional: Alert and oriented.  Answers questions appropriately. Eyes: Conjunctivae are normal.  EOMI. No scleral icterus.  No eye discharge. Head: Atraumatic. Nose: No congestion/rhinnorhea. Mouth/Throat: Mucous membranes are moist.  No posterior pharyngeal erythema, tonsillar swelling or exudate.  The posterior palate is symmetric and the uvula is midline.  The patient has no drooling, trismus,  stridor or hoarse voice. Neck: No stridor.  Supple.  No JVD.  No meningismus. Cardiovascular: Normal rate, regular rhythm. No murmurs, rubs or gallops.  Respiratory: Normal respiratory effort.  No accessory muscle use or retractions. Lungs CTAB.  No wheezes, rales or ronchi.  Patient does have induced coughing with deep breaths. Gastrointestinal: Soft, nontender and nondistended.  No guarding or rebound.  No peritoneal signs. Musculoskeletal: No LE edema. No ttp in the calves or palpable cords.  Negative Homan's sign. Neurologic:  A&Ox3.  Speech is clear.  Face and  smile are symmetric.  EOMI.  Moves all extremities well. Skin:  Skin is warm, dry and intact. No rash noted. Psychiatric: Mood and affect are normal. Speech and behavior are normal.  Normal judgement.  ____________________________________________   LABS (all labs ordered are listed, but only abnormal results are displayed)  Labs Reviewed  INFLUENZA PANEL BY PCR (TYPE A & B)   ____________________________________________  EKG  ED ECG REPORT I, Anne-Caroline Sharma CovertNorman, the attending physician, personally viewed and interpreted this ECG.   Date: 02/26/2018  EKG Time: 1828  Rate: 69  Rhythm: normal sinus rhythm  Axis: normal  Intervals:none  ST&T Change: No STEMI  ____________________________________________  RADIOLOGY  Dg Chest 2 View  Result Date: 02/26/2018 CLINICAL DATA:  Productive cough for 4 days. EXAM: CHEST - 2 VIEW COMPARISON:  None. FINDINGS: Patchy LEFT lower lobe airspace opacity. No pleural effusion. Cardiomediastinal silhouette is normal. No pneumothorax. Soft tissue planes and included osseous structures are non suspicious. IMPRESSION: LEFT lower lobe pneumonia. Electronically Signed   By: Awilda Metroourtnay  Bloomer M.D.   On: 02/26/2018 18:38    ____________________________________________   PROCEDURES  Procedure(s) performed: None  Procedures  Critical Care performed: No ____________________________________________   INITIAL IMPRESSION / ASSESSMENT AND PLAN / ED COURSE  Pertinent labs & imaging results that were available during my care of the patient were reviewed by me and considered in my medical decision making (see chart for details).  28 y.o. female, otherwise healthy, presenting with 6 days of cough, now with chest tightness and shortness of breath which is worse when she lays down.  Overall, the patient is hemodynamically stable.  She most likely has a viral URI.  I would also consider influenza although she has not been having any GI symptoms.  I do not  hear a pneumonia, will get a chest x-ray to rule this out.  It is very unlikely that the patient has ACS or MI, but will do a screening EKG to evaluate.  The patient will receive symptomatic treatment in the emergency department and be reevaluated for final disposition.  ----------------------------------------- 6:48 PM on 02/26/2018 -----------------------------------------  The patient's chest x-ray is consistent with a left lower note lobe pneumonia.  She is low risk and has reassuring oxygenation.  She will receive 500 mg of azithromycin here, and be discharged home with the remainder of a Z-Pak with 4 more days of 250 mg.  She will also be given Jerilynn Somessalon Perles and an albuterol MDI for her coughing.  ----------------------------------------- 7:35 PM on 02/26/2018 -----------------------------------------  The patient's influenza testing is negative.  At this time, the patient is safe for discharge home.  I have discussed the patient's work-up and findings with her, as well as the plan for management of her pneumonia.  She will have close follow-up with the PMD, and understands return precautions  ____________________________________________  FINAL CLINICAL IMPRESSION(S) / ED DIAGNOSES  Final diagnoses:  Pneumonia of left lower lobe due to  infectious organism (HCC)         NEW MEDICATIONS STARTED DURING THIS VISIT:  New Prescriptions   ALBUTEROL (PROVENTIL HFA;VENTOLIN HFA) 108 (90 BASE) MCG/ACT INHALER    Inhale 2 puffs into the lungs every 6 (six) hours as needed for wheezing or shortness of breath.   AZITHROMYCIN (ZITHROMAX) 250 MG TABLET    Take 1 tablet (250 mg total) by mouth daily for 4 days. Take 1 tablet daily for 3 days.   BENZONATATE (TESSALON PERLES) 100 MG CAPSULE    Take 1 capsule (100 mg total) by mouth every 6 (six) hours as needed for cough.      Rockne MenghiniNorman, Anne-Caroline, MD 02/26/18 1849    Rockne MenghiniNorman, Anne-Caroline, MD 02/26/18 [phone]

## 2018-02-26 NOTE — Discharge Instructions (Addendum)
Patient plenty of fluid to stay well-hydrated.  You may take albuterol or Tessalon Perles for coughing spasms.  Take the entire course of antibiotics, even if you are feeling better.  You may take Tylenol or Motrin for pain and fever.  Your symptoms may last several days to several weeks.  Please wash your hands frequently and thoroughly to prevent the spread of infection.  Return to the emergency department if you develop severe pain, lightheadedness or fainting, shortness of breath, inability to keep down fluids, or any other symptoms concerning to you.

## 2018-02-26 NOTE — ED Triage Notes (Signed)
Ipad interpreter used for triage. Pt states she is here for a productive cough she has had for 4 days. PT also reports generalized body aches.

## 2018-02-26 NOTE — ED Notes (Signed)
Patient transported to X-ray 

## 2019-04-20 ENCOUNTER — Ambulatory Visit: Payer: Self-pay | Attending: Internal Medicine

## 2019-04-20 ENCOUNTER — Other Ambulatory Visit: Payer: Self-pay

## 2019-04-20 DIAGNOSIS — Z23 Encounter for immunization: Secondary | ICD-10-CM

## 2019-04-20 NOTE — Progress Notes (Signed)
   Covid-19 Vaccination Clinic  Name:  Erica Livingston    MRN: [id number] DOB: 17-Aug-1990  04/20/2019  Ms. Erica Livingston was observed post Covid-19 immunization for 15 minutes without incident. She was provided with Vaccine Information Sheet and instruction to access the V-Safe system.   Ms. Erica Livingston was instructed to call 911 with any severe reactions post vaccine: Marland Kitchen Difficulty breathing  . Swelling of face and throat  . A fast heartbeat  . A bad rash all over body  . Dizziness and weakness   Immunizations Administered    Name Date Dose VIS Date Route   Pfizer COVID-19 Vaccine 04/20/2019  6:22 PM 0.3 mL 01/09/2019 Intramuscular   Manufacturer: [employer], [employer]   Lot: [id number]   NDC: [address]-5

## 2019-05-11 ENCOUNTER — Ambulatory Visit: Payer: Self-pay | Attending: Internal Medicine

## 2019-05-11 DIAGNOSIS — Z23 Encounter for immunization: Secondary | ICD-10-CM

## 2019-05-11 NOTE — Progress Notes (Signed)
   Covid-19 Vaccination Clinic  Name:  Layza Summa    MRN: [id number] DOB: 11/17/1990  05/11/2019  Ms. Cherrill Scrima was observed post Covid-19 immunization for 15 minutes without incident. She was provided with Vaccine Information Sheet and instruction to access the V-Safe system. Medical Interpreter used.  Ms. Jamonica Schoff was instructed to call 911 with any severe reactions post vaccine: Marland Kitchen Difficulty breathing  . Swelling of face and throat  . A fast heartbeat  . A bad rash all over body  . Dizziness and weakness   Immunizations Administered    Name Date Dose VIS Date Route   Pfizer COVID-19 Vaccine 05/11/2019  5:47 PM 0.3 mL 01/09/2019 Intramuscular   Manufacturer: [employer], [employer]   Lot: [id number]   NDC: [address]-8

## 2020-02-22 ENCOUNTER — Ambulatory Visit: Payer: Self-pay

## 2021-06-28 ENCOUNTER — Ambulatory Visit
Admission: RE | Admit: 2021-06-28 | Discharge: 2021-06-28 | Disposition: A | Discharge status: Home / Self Care | Payer: Self-pay | Source: Ambulatory Visit | Attending: Family Medicine | Admitting: Family Medicine

## 2021-06-28 ENCOUNTER — Other Ambulatory Visit: Payer: Self-pay | Admitting: Family Medicine

## 2021-06-28 DIAGNOSIS — R52 Pain, unspecified: Secondary | ICD-10-CM

## 2022-02-07 ENCOUNTER — Ambulatory Visit (LOCAL_COMMUNITY_HEALTH_CENTER): Payer: Self-pay | Admitting: Nurse Practitioner

## 2022-02-07 VITALS — BP 119/76 | Wt 137.6 lb

## 2022-02-07 DIAGNOSIS — Z01419 Encounter for gynecological examination (general) (routine) without abnormal findings: Secondary | ICD-10-CM

## 2022-02-07 DIAGNOSIS — E079 Disorder of thyroid, unspecified: Secondary | ICD-10-CM

## 2022-02-07 DIAGNOSIS — Z30017 Encounter for initial prescription of implantable subdermal contraceptive: Secondary | ICD-10-CM

## 2022-02-07 DIAGNOSIS — Z3046 Encounter for surveillance of implantable subdermal contraceptive: Secondary | ICD-10-CM

## 2022-02-07 DIAGNOSIS — Z113 Encounter for screening for infections with a predominantly sexual mode of transmission: Secondary | ICD-10-CM

## 2022-02-07 DIAGNOSIS — Z3009 Encounter for other general counseling and advice on contraception: Secondary | ICD-10-CM

## 2022-02-07 DIAGNOSIS — Z3202 Encounter for pregnancy test, result negative: Secondary | ICD-10-CM

## 2022-02-07 LAB — WET PREP FOR TRICH, YEAST, CLUE
Trichomonas Exam: NEGATIVE
Yeast Exam: NEGATIVE

## 2022-02-07 LAB — HM HIV SCREENING LAB: HM HIV Screening: NEGATIVE

## 2022-02-07 LAB — PREGNANCY, URINE: Preg Test, Ur: NEGATIVE

## 2022-02-07 MED ORDER — ETONOGESTREL 68 MG ~~LOC~~ IMPL
68.0000 mg | DRUG_IMPLANT | Freq: Once | SUBCUTANEOUS | Status: AC
  Administered 2022-02-07: 68 mg via SUBCUTANEOUS

## 2022-02-07 NOTE — Progress Notes (Signed)
[facility] [place] Number: [phone]    Family Planning Visit- Initial Visit  Subjective:  Erica Livingston is a 32 y.o.  G2P0   being seen today for an initial annual visit and to discuss reproductive life planning.  The patient is currently using Hormonal Implant for pregnancy prevention. Patient reports   does not want a pregnancy in the next year.     report they are looking for a method that provides High efficacy at preventing pregnancy  Patient has the following medical conditions does not have any active problems on file.  Chief Complaint  Patient presents with   Annual Exam    PE, Nexplanon removal and reinsetion    Patient reports to clinic today for a physical and Nexplanon removal and reinsertion.     Body mass index is 25.17 kg/m. - Patient is eligible for diabetes screening based on BMI> 25 and age >30?  not applicable ZS0F ordered? not applicable  Patient reports 1  partner/s in last year. Desires STI screening?  Yes  Has patient been screened once for HCV in the past?  No    Does the patient have current drug use (including MJ), have a partner with drug use, and/or has been incarcerated since last result? No  If yes-- Screen for HCV through [facility]   Does the patient meet criteria for HBV testing? No  Criteria:  -Household, sexual or needle sharing contact with HBV -HIV positive -Those with known Hep C   Health Maintenance Due  Topic Date Due   Hepatitis C Screening  Never done   DTaP/Tdap/Td (1 - Tdap) Never done   PAP SMEAR-Modifier  Never done   INFLUENZA VACCINE  Never done   COVID-19 Vaccine (3 - 2023-24 season) 09/29/2021    Review of Systems  Constitutional:  Negative for chills, fever, malaise/fatigue and weight loss.  HENT:  Negative for congestion, hearing loss and sore throat.   Eyes:  Negative for blurred vision, double vision and photophobia.   Respiratory:  Negative for shortness of breath.   Cardiovascular:  Negative for chest pain.  Gastrointestinal:  Negative for abdominal pain, blood in stool, constipation, diarrhea, heartburn, nausea and vomiting.  Genitourinary:  Negative for dysuria and frequency.  Musculoskeletal:  Negative for back pain, joint pain and neck pain.  Skin:  Negative for itching and rash.  Neurological:  Positive for headaches. Negative for dizziness and weakness.  Endo/Heme/Allergies:  Does not bruise/bleed easily.  Psychiatric/Behavioral:  Negative for depression, substance abuse and suicidal ideas.     The following portions of the patient's history were reviewed and updated as appropriate: allergies, current medications, past family history, past medical history, past social history, past surgical history and problem list. Problem list updated.   See flowsheet for other program required questions.  Objective:   Vitals:   02/07/22 1326  BP: 119/76  Weight: 137 lb 9.6 oz (62.4 kg)    Physical Exam Constitutional:      Appearance: Normal appearance.  HENT:     Head: Normocephalic. No abrasion, masses or laceration. Hair is normal.     Jaw: No tenderness or swelling.     Right Ear: External ear normal.     Left Ear: External ear normal.     Nose: Nose normal.     Mouth/Throat:     Lips: Pink. No lesions.     Mouth: Mucous membranes are moist. No lacerations or  oral lesions.     Dentition: No dental caries.     Tongue: No lesions.     Palate: No mass and lesions.     Pharynx: No pharyngeal swelling, oropharyngeal exudate, posterior oropharyngeal erythema or uvula swelling.     Tonsils: No tonsillar exudate or tonsillar abscesses.  Eyes:     Pupils: Pupils are equal, round, and reactive to light.  Neck:     Thyroid: Thyroid mass present. No thyromegaly or thyroid tenderness.  Cardiovascular:     Rate and Rhythm: Normal rate and regular rhythm.  Pulmonary:     Effort: Pulmonary effort is  normal.     Breath sounds: Normal breath sounds.  Chest:  Breasts:    Right: Normal. No swelling, mass, nipple discharge, skin change or tenderness.     Left: Normal. No swelling, mass, nipple discharge, skin change or tenderness.  Abdominal:     General: Abdomen is flat. Bowel sounds are normal.     Palpations: Abdomen is soft.     Tenderness: There is no abdominal tenderness. There is no rebound.  Genitourinary:    Pubic Area: No rash or pubic lice.      Labia:        Right: No rash, tenderness or lesion.        Left: No rash, tenderness or lesion.      Vagina: Normal. No vaginal discharge, erythema, tenderness or lesions.     Cervix: No cervical motion tenderness, discharge, lesion or erythema.     Uterus: Normal.      Adnexa:        Right: No tenderness.         Left: No tenderness.       Rectum: Normal.     Comments: Amount Discharge: small  pH: less than 4.5 Adheres to vaginal wall: No Color:   Musculoskeletal:     Cervical back: Full passive range of motion without pain and normal range of motion.  Lymphadenopathy:     Cervical: No cervical adenopathy.     Right cervical: No superficial, deep or posterior cervical adenopathy.    Left cervical: No superficial, deep or posterior cervical adenopathy.     Upper Body:     Right upper body: No supraclavicular, axillary or epitrochlear adenopathy.     Left upper body: No supraclavicular, axillary or epitrochlear adenopathy.     Lower Body: No right inguinal adenopathy. No left inguinal adenopathy.  Skin:    General: Skin is warm and dry.     Findings: No erythema, laceration, lesion or rash.  Neurological:     Mental Status: She is alert and oriented to person, place, and time.  Psychiatric:        Attention and Perception: Attention normal.        Mood and Affect: Mood normal.        Speech: Speech normal.        Behavior: Behavior normal. Behavior is cooperative.       Assessment and Plan:  Erica Livingston is a 32 y.o. female presenting to the [facility] Department for an initial annual wellness/contraceptive visit  Contraception counseling: Reviewed options based on patient desire and reproductive life plan. Patient is interested in Hormonal Injection. This was provided to the patient today.   Risks, benefits, and typical effectiveness rates were reviewed.  Questions were answered.  Written information was also given to the patient to review.    The patient will follow up  in  1 years for surveillance.  The patient was told to call with any further questions, or with any concerns about this method of contraception.  Emphasized use of condoms 100% of the time for STI prevention.  Need for ECP was assessed. ECP not offered due to continued use of birth control and protected sex.   1. Family planning counseling -32 year old female in clinic today for a physical and STD screening.  -ROS reviewed, patient reports a history of migraines and reports being on medication.  Patient to follow up with PCP regarding management.  -Patient desires to continue with a Nexplanon as her birth control method.  -Patient agrees to STD screening today.  -PT negative today.   - Pregnancy, urine  2. Well woman exam with routine gynecological exam -Normal well woman exam. -CBE today, next due 01/2025 -PAP performed today.  - IGP, Aptima HPV  3. Screening examination for venereal disease -STD screening today. -Patient accepted all screenings including vaginal CT/GC, wet prep and bloodwork for HIV/RPR.  Patient meets criteria for HepB screening? No. Ordered? No - low risk Patient meets criteria for HepC screening? No. Ordered? No, low risk   Treat wet prep per standing order Discussed time line for [facility] results and that patient will be called with positive results and encouraged patient to call if she had not heard in 2 weeks.  Counseled to return or seek care for continued or worsening  symptoms Recommended condom use with all sex  Patient is currently using *Nexplanon to prevent pregnancy.    - [facility] - Syphilis Serology,  Lab - [place], [place], [place]  4. Encounter for removal and reinsertion of Nexplanon -Patient identified, informed consent performed, consent signed.   Patient does understand that irregular bleeding is a very common side effect of this medication. She was advised to have backup contraception for one week after replacement of the implant. Patient deemed to meet WHO criteria for being reasonably certain she is not pregnant.  Appropriate time out taken. Nexplanon site identified. Area prepped in usual sterile fashon. 3 ml of 1% lidocaine with epinephrine was used to anesthetize the area at the distal end of the implant. A small stab incision was made right beside the implant on the distal portion. The Nexplanon rod was grasped using hemostats and removed without difficulty. There was minimal blood loss. There were no complications.   Confirmed correct location of insertion site. The insertion site was identified 8-10 cm (3-4 inches) from the medial epicondyle of the humerus and 3-5 cm (1.25-2 inches) posterior to (below) the sulcus (groove) between the biceps and triceps muscles of the patient's left arm. New Nexplanon removed from packaging, Device confirmed in needle, then inserted full length of needle and withdrawn per handbook instructions. Nexplanon was able to palpated in the patient's left arm; patient palpated the insert herself.  There was minimal blood loss. Patient insertion site covered with guaze and a pressure bandage to reduce any bruising. The patient tolerated the procedure well and was given post procedure instructions.      5. Thyroid mass -Thyroid mass palpated on physical exam.  Patient has a PCP, advised to follow up with PCP regarding potential thyroid mass.   Total time spent:  45 minutes    Due to a language barrier an interpreter Jamas [place] was used for the provider portion of the visit.     Return in about  1 year (around 02/08/2023) for Annual well-woman exam.    Glenna Fellows, FNP

## 2022-02-07 NOTE — Progress Notes (Unsigned)
Pt is here for PE, Pap, STD screening, Nexplanon removal and reinsertion.  Provider completed procedures., pt tolerated well.  Wet mount results reviewed, no treatment required.  Pt notified of negative UPT  and was given FP packet.  Windle Guard, RN

## 2022-02-09 ENCOUNTER — Ambulatory Visit: Payer: Self-pay

## 2022-02-12 LAB — IGP, APTIMA HPV
HPV Aptima: NEGATIVE
PAP Smear Comment: 0

## 2022-03-06 ENCOUNTER — Ambulatory Visit (LOCAL_COMMUNITY_HEALTH_CENTER): Payer: Self-pay

## 2022-03-06 DIAGNOSIS — Z23 Encounter for immunization: Secondary | ICD-10-CM

## 2022-03-06 DIAGNOSIS — Z7185 Encounter for immunization safety counseling: Secondary | ICD-10-CM

## 2022-03-06 NOTE — Progress Notes (Signed)
In nurse clinic for Hep B and Flu vaccines as needed for legal residence. Lang line [phone].  Pt has no insurance and pays today for flu vaccine. [employer] Copywriter, advertising for Hep B vaccine completed and successfully faxed. Letter attesting to patient income faxed along with application. Application sent for scanning. Phone call from [employer] PAP and application approved for Hep B vaccine (Recombivax). Flu and Hep B vaccines given today and tolerated well. Updated [facility] copy given and recommended schedule explained. Josie Saunders, RN

## 2022-05-08 ENCOUNTER — Ambulatory Visit: Payer: Self-pay

## 2022-05-17 ENCOUNTER — Ambulatory Visit: Payer: Self-pay

## 2022-05-17 ENCOUNTER — Ambulatory Visit (LOCAL_COMMUNITY_HEALTH_CENTER): Payer: Self-pay

## 2022-05-17 DIAGNOSIS — Z719 Counseling, unspecified: Secondary | ICD-10-CM

## 2022-05-17 DIAGNOSIS — Z23 Encounter for immunization: Secondary | ICD-10-CM

## 2022-05-17 NOTE — Progress Notes (Signed)
Pt in clinic for 2nd dose Hep B. Faxed application to [employer] patient Assistance program for free Hep B vaccine and was approved for private Hep B dose # 2. Pt also agreed for Tdap vaccine. Administered vaccines, tolerated well. Given VIS and NCIR copies, explained and understood. M.Urie Loughner, LPN.

## 2022-10-04 ENCOUNTER — Ambulatory Visit: Payer: Self-pay

## 2022-10-04 ENCOUNTER — Ambulatory Visit (LOCAL_COMMUNITY_HEALTH_CENTER): Payer: Self-pay

## 2022-10-04 DIAGNOSIS — Z719 Counseling, unspecified: Secondary | ICD-10-CM

## 2022-10-04 DIAGNOSIS — Z23 Encounter for immunization: Secondary | ICD-10-CM

## 2022-10-04 NOTE — Progress Notes (Signed)
In nurse clinic for Hep B dose #3. [employer] Multimedia programmer completed and approved. Hep B given and tolerated well. Updated [facility] copy given and explained. Alex Gardener, interpreter today. Jerel Shepherd, RN

## 2024-01-01 IMAGING — CR DG THORACIC SPINE 3V
1 series · 3 of 3 positions shown · non-contrast
Comparison: None Available.

CLINICAL DATA: Back pain for several weeks, worse with movement and
lifting, no reported injury

EXAM:
LUMBAR SPINE - COMPLETE 4+ VIEW; THORACIC SPINE - 3 VIEWS

[Series 1: dg thoracic spine w/swimmers · 0.14mm/px · 3 of 3 slices shown]
[im 1/3]
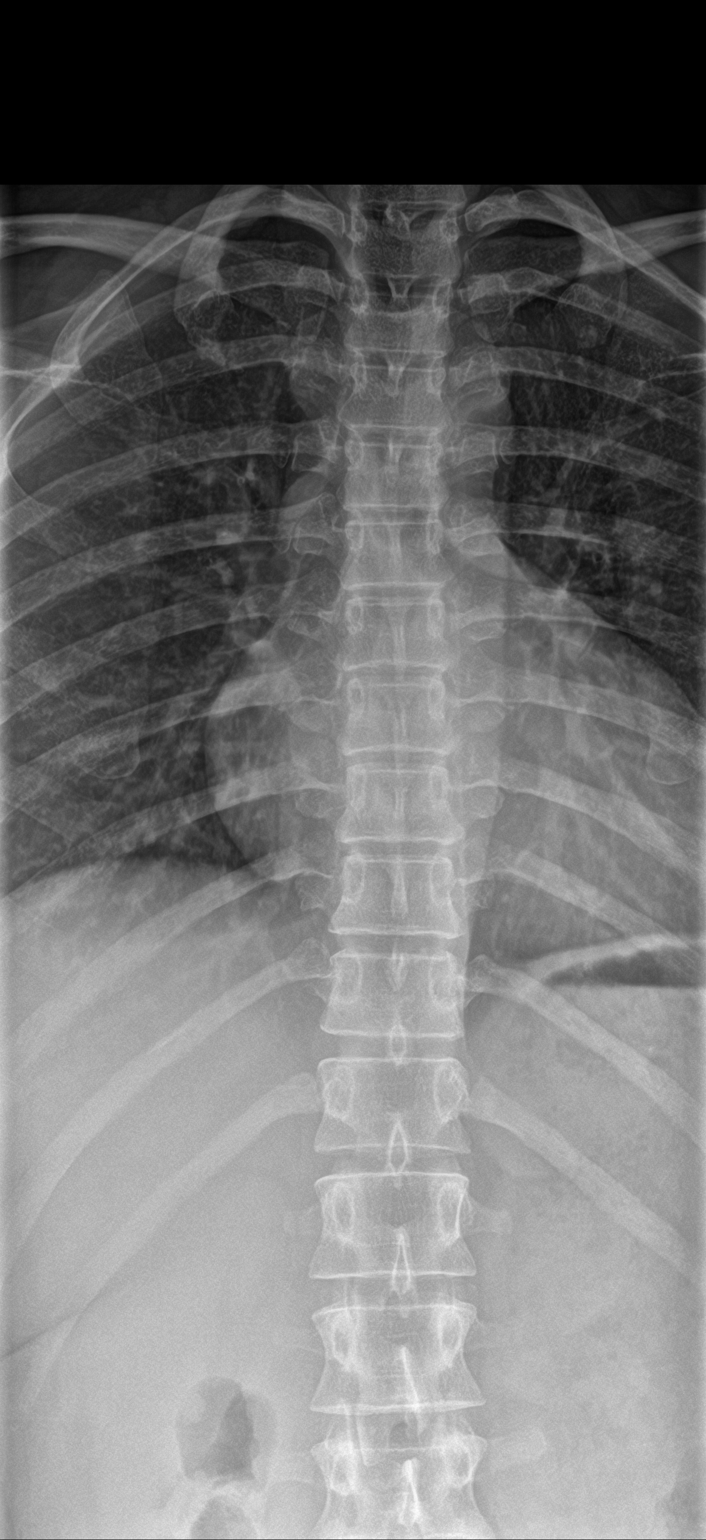
[im 2/3]
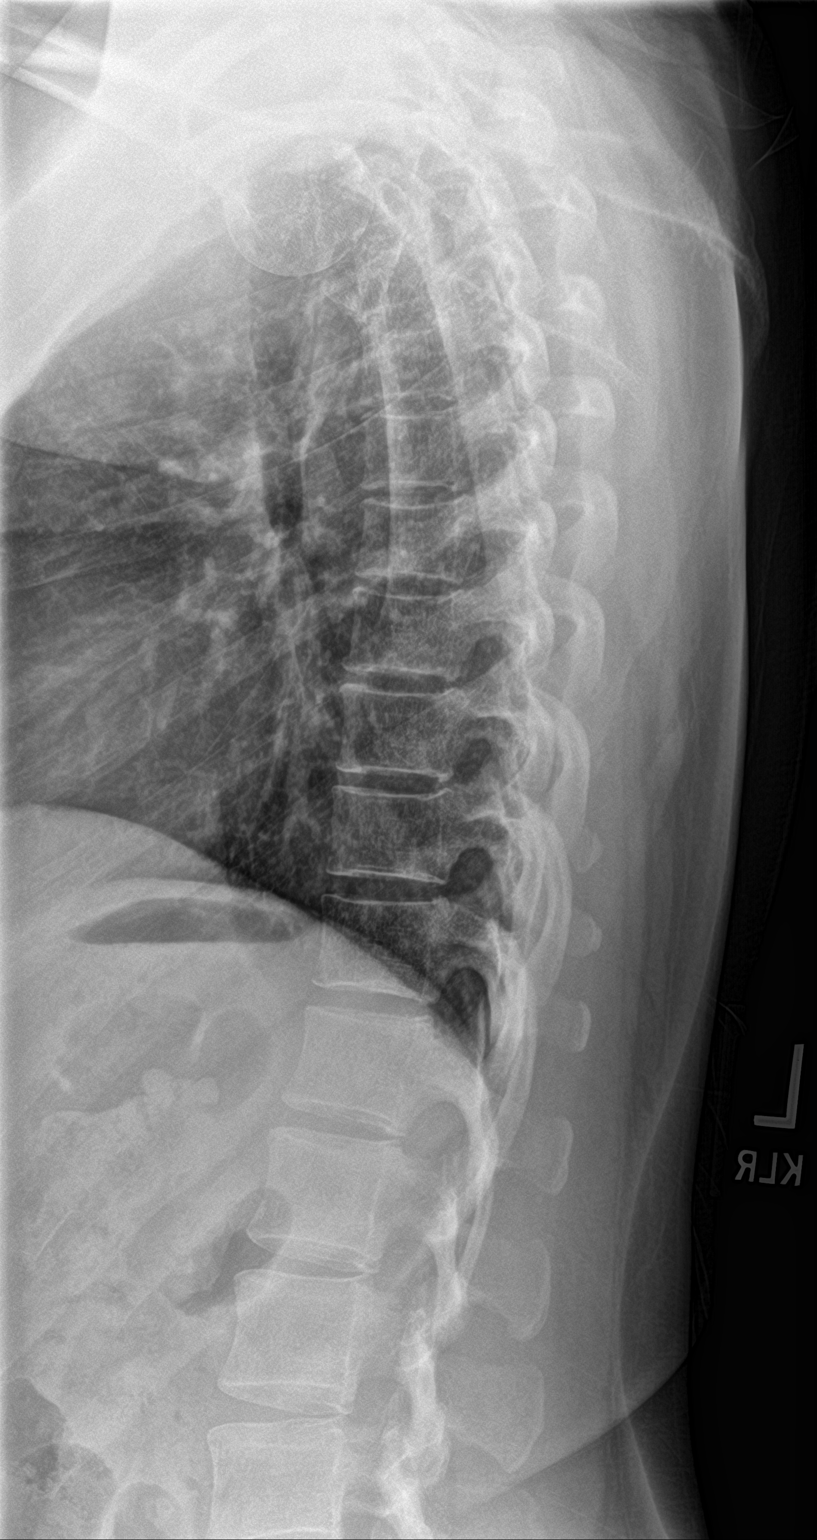
[im 3/3]
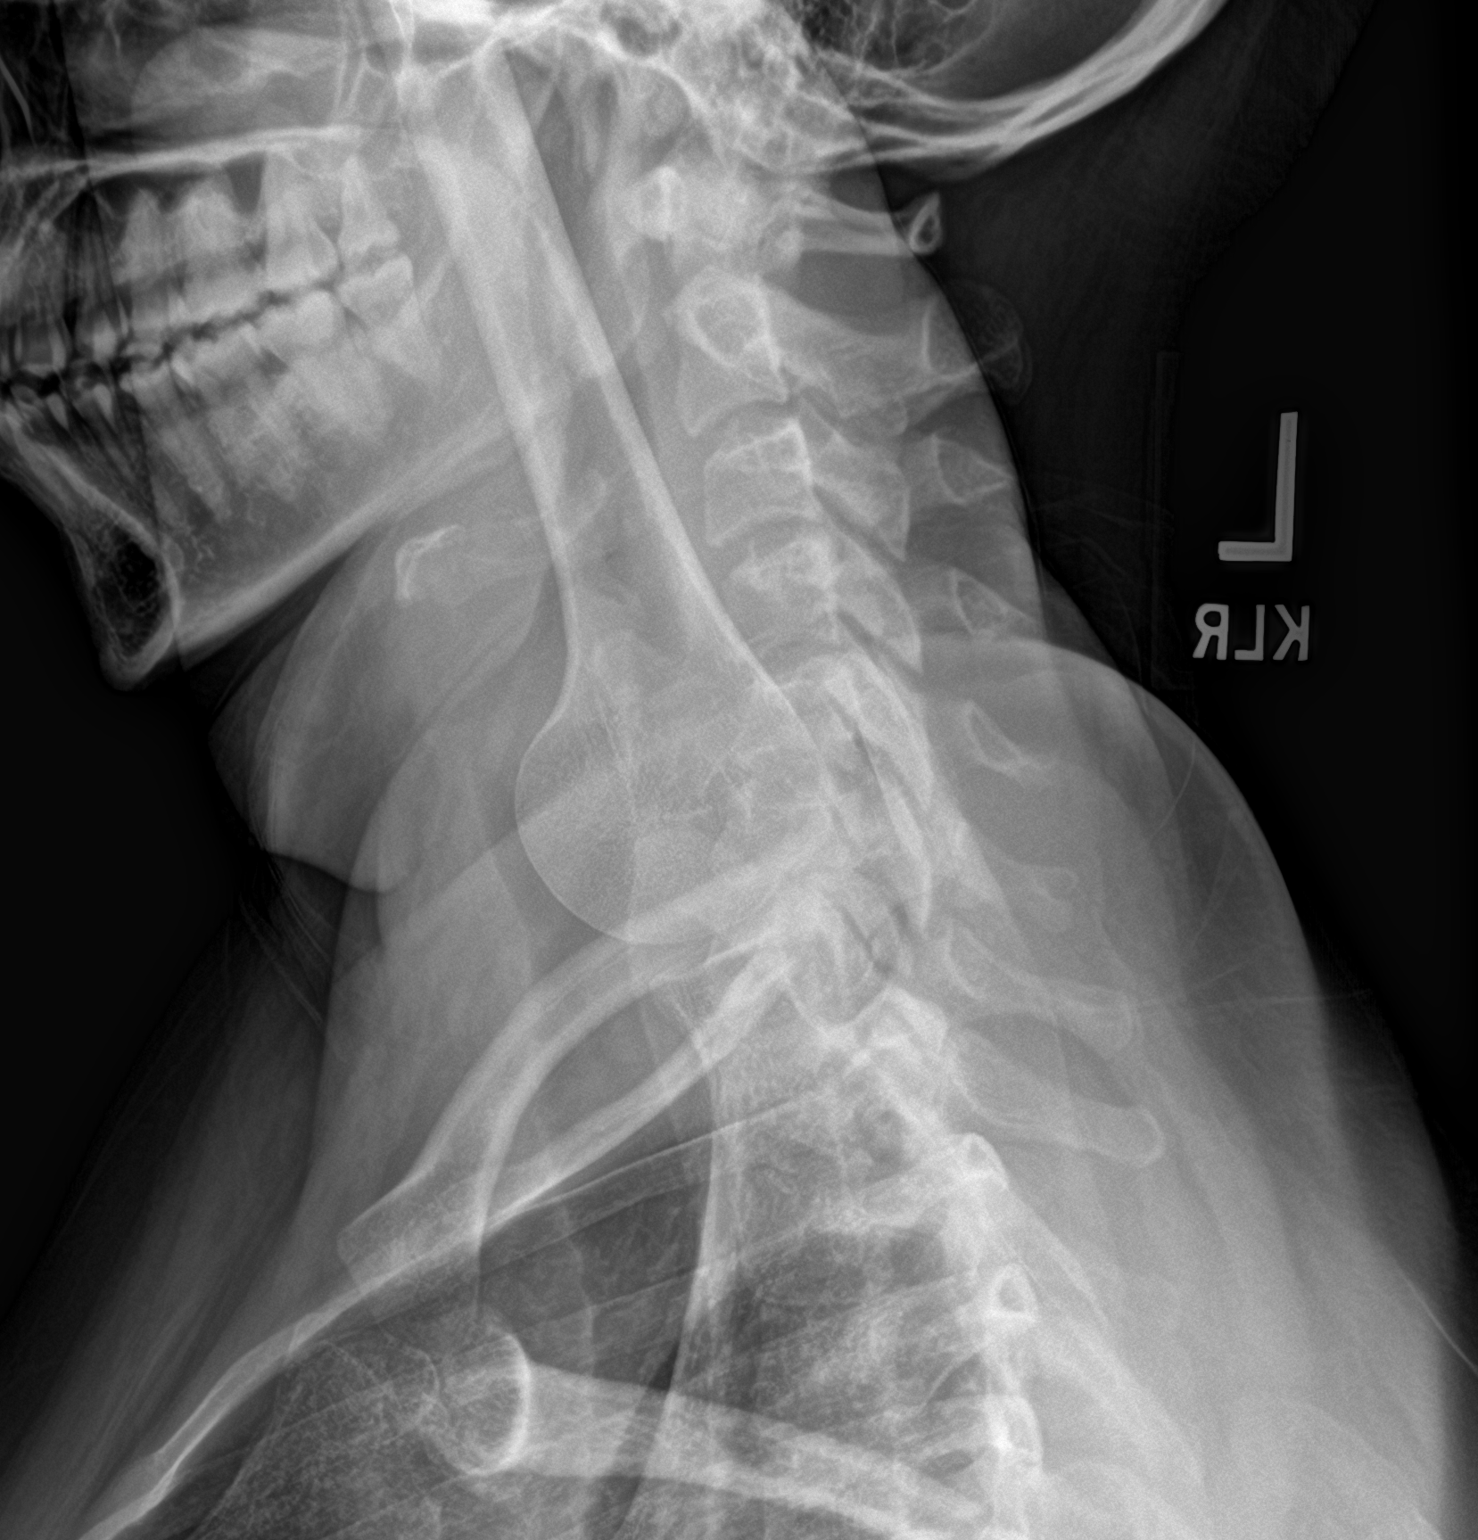

[3 of 3 positions shown; findings below may reference images not displayed]

FINDINGS: There is no evidence of thoracic or lumbar spine fracture. Alignment
is normal. Intervertebral disc spaces are maintained. The partially
imaged chest is unremarkable. Nonobstructive pattern of overlying
bowel gas.
IMPRESSION: No fracture or dislocation of the thoracic or lumbar spine. Disc
spaces and vertebral body heights are preserved.
